# Patient Record
Sex: Male | Born: 1950 | Race: White | Hispanic: No | Marital: Married | Smoking: Never smoker
Health system: Southern US, Community
[De-identification: ages and names within clinical notes are randomized; demographics above are authoritative.]

---

## 2004-03-24 ENCOUNTER — Emergency Department (HOSPITAL_COMMUNITY): Admission: EM | Admit: 2004-03-24 | Discharge: 2004-03-24 | Payer: Self-pay | Admitting: *Deleted

## 2008-03-17 ENCOUNTER — Emergency Department (HOSPITAL_COMMUNITY): Admission: EM | Admit: 2008-03-17 | Discharge: 2008-03-17 | Payer: Self-pay | Admitting: Emergency Medicine

## 2010-10-02 ENCOUNTER — Encounter: Payer: Self-pay | Admitting: Family Medicine

## 2011-06-08 LAB — POCT I-STAT, CHEM 8
Calcium, Ion: 1.21
Creatinine, Ser: 1
HCT: 41
Hemoglobin: 13.9
Sodium: 141
TCO2: 24

## 2011-06-08 LAB — POCT CARDIAC MARKERS
Myoglobin, poc: 94.6
Operator id: 285491
Operator id: 285491

## 2014-09-18 ENCOUNTER — Encounter (HOSPITAL_COMMUNITY): Payer: Self-pay | Admitting: Emergency Medicine

## 2014-09-18 ENCOUNTER — Emergency Department (INDEPENDENT_AMBULATORY_CARE_PROVIDER_SITE_OTHER)
Admission: EM | Admit: 2014-09-18 | Discharge: 2014-09-18 | Disposition: A | Discharge status: Home / Self Care | Payer: BLUE CROSS/BLUE SHIELD | Source: Home / Self Care | Attending: Family Medicine | Admitting: Family Medicine

## 2014-09-18 DIAGNOSIS — N201 Calculus of ureter: Secondary | ICD-10-CM | POA: Diagnosis not present

## 2014-09-18 LAB — POCT URINALYSIS DIP (DEVICE)
Bilirubin Urine: NEGATIVE
Glucose, UA: NEGATIVE mg/dL
Ketones, ur: NEGATIVE mg/dL
Leukocytes, UA: NEGATIVE
Nitrite: NEGATIVE
PROTEIN: NEGATIVE mg/dL
UROBILINOGEN UA: 0.2 mg/dL (ref 0.0–1.0)
pH: 5 (ref 5.0–8.0)

## 2014-09-18 MED ORDER — KETOROLAC TROMETHAMINE 60 MG/2ML IM SOLN
INTRAMUSCULAR | Status: AC
  Filled 2014-09-18: qty 2

## 2014-09-18 MED ORDER — ONDANSETRON HCL 4 MG/2ML IJ SOLN
4.0000 mg | Freq: Once | INTRAMUSCULAR | Status: AC
  Administered 2014-09-18: 4 mg via INTRAMUSCULAR

## 2014-09-18 MED ORDER — ONDANSETRON HCL 4 MG/2ML IJ SOLN
INTRAMUSCULAR | Status: AC
  Filled 2014-09-18: qty 2

## 2014-09-18 MED ORDER — KETOROLAC TROMETHAMINE 60 MG/2ML IM SOLN
60.0000 mg | Freq: Once | INTRAMUSCULAR | Status: AC
  Administered 2014-09-18: 60 mg via INTRAMUSCULAR

## 2014-09-18 NOTE — Discharge Instructions (Signed)
Go directly to see dr t. For further eval.

## 2014-09-18 NOTE — ED Provider Notes (Signed)
CSN: [id number]     Arrival date & time 09/18/14  1448 History   First MD Initiated Contact with Patient 09/18/14 1457     Chief Complaint  Patient presents with  . Abdominal Pain   (Consider location/radiation/quality/duration/timing/severity/associated sxs/prior Treatment) Patient is a 64 y.o. male presenting with abdominal pain.  Abdominal Pain Pain location:  R flank (pt was fine prior to sudden onset in right flank.) Pain quality: cramping and pressure   Pain radiates to:  R flank and suprapubic region Pain severity:  Moderate Onset quality:  Sudden Duration:  2 hours Progression:  Unchanged Chronicity:  New Relieved by:  None tried Worsened by:  Nothing tried Ineffective treatments:  None tried Associated symptoms: nausea   Associated symptoms: no fever     History reviewed. No pertinent past medical history. History reviewed. No pertinent past surgical history. History reviewed. No pertinent family history. History  Substance Use Topics  . Smoking status: Never Smoker   . Smokeless tobacco: Not on file  . Alcohol Use: No    Review of Systems  Constitutional: Negative.  Negative for fever.  Gastrointestinal: Positive for nausea and abdominal pain.    Allergies  Review of patient's allergies indicates no known allergies.  Home Medications   Prior to Admission medications   Not on File   There were no vitals taken for this visit. Physical Exam  Constitutional: He is oriented to person, place, and time. He appears well-developed and well-nourished. He appears distressed.  Neck: Normal range of motion. Neck supple.  Cardiovascular: Normal heart sounds.   Pulmonary/Chest: Effort normal and breath sounds normal.  Abdominal: Soft. Bowel sounds are normal. He exhibits no distension and no mass. There is tenderness. There is no rebound and no guarding.  Neurological: He is alert and oriented to person, place, and time.  Skin: Skin is warm. He is diaphoretic.   Nursing note and vitals reviewed.   ED Course  Procedures (including critical care time) Labs Review Labs Reviewed  POCT URINALYSIS DIP (DEVICE) - Abnormal; Notable for the following:    Hgb urine dipstick TRACE (*)    All other components within normal limits    Imaging Review No results found.   MDM   1. Right ureteral stone        Billy Fischer, MD 09/18/14 1606

## 2014-09-18 NOTE — ED Notes (Signed)
Pt states that he has had abdominal pain since 1230 today. Pt states that it is very difficult to urinate

## 2017-06-28 DIAGNOSIS — Z23 Encounter for immunization: Secondary | ICD-10-CM | POA: Diagnosis not present

## 2017-07-25 DIAGNOSIS — N529 Male erectile dysfunction, unspecified: Secondary | ICD-10-CM | POA: Diagnosis not present

## 2017-07-25 DIAGNOSIS — J309 Allergic rhinitis, unspecified: Secondary | ICD-10-CM | POA: Diagnosis not present

## 2017-07-25 DIAGNOSIS — E782 Mixed hyperlipidemia: Secondary | ICD-10-CM | POA: Diagnosis not present

## 2017-07-25 DIAGNOSIS — M542 Cervicalgia: Secondary | ICD-10-CM | POA: Diagnosis not present

## 2017-07-25 DIAGNOSIS — Z23 Encounter for immunization: Secondary | ICD-10-CM | POA: Diagnosis not present

## 2017-07-25 DIAGNOSIS — Z0000 Encounter for general adult medical examination without abnormal findings: Secondary | ICD-10-CM | POA: Diagnosis not present

## 2017-07-25 DIAGNOSIS — Z125 Encounter for screening for malignant neoplasm of prostate: Secondary | ICD-10-CM | POA: Diagnosis not present

## 2017-07-25 DIAGNOSIS — G43909 Migraine, unspecified, not intractable, without status migrainosus: Secondary | ICD-10-CM | POA: Diagnosis not present

## 2017-07-25 DIAGNOSIS — E669 Obesity, unspecified: Secondary | ICD-10-CM | POA: Diagnosis not present

## 2017-09-26 DIAGNOSIS — D120 Benign neoplasm of cecum: Secondary | ICD-10-CM | POA: Diagnosis not present

## 2017-09-26 DIAGNOSIS — Z1211 Encounter for screening for malignant neoplasm of colon: Secondary | ICD-10-CM | POA: Diagnosis not present

## 2017-10-02 DIAGNOSIS — D120 Benign neoplasm of cecum: Secondary | ICD-10-CM | POA: Diagnosis not present

## 2018-02-01 DIAGNOSIS — T148XXA Other injury of unspecified body region, initial encounter: Secondary | ICD-10-CM | POA: Diagnosis not present

## 2018-02-01 DIAGNOSIS — Z23 Encounter for immunization: Secondary | ICD-10-CM | POA: Diagnosis not present

## 2018-08-02 DIAGNOSIS — Z0000 Encounter for general adult medical examination without abnormal findings: Secondary | ICD-10-CM | POA: Diagnosis not present

## 2018-08-02 DIAGNOSIS — G43909 Migraine, unspecified, not intractable, without status migrainosus: Secondary | ICD-10-CM | POA: Diagnosis not present

## 2018-08-02 DIAGNOSIS — Z125 Encounter for screening for malignant neoplasm of prostate: Secondary | ICD-10-CM | POA: Diagnosis not present

## 2018-08-02 DIAGNOSIS — M542 Cervicalgia: Secondary | ICD-10-CM | POA: Diagnosis not present

## 2018-08-02 DIAGNOSIS — Z6831 Body mass index (BMI) 31.0-31.9, adult: Secondary | ICD-10-CM | POA: Diagnosis not present

## 2018-08-02 DIAGNOSIS — E782 Mixed hyperlipidemia: Secondary | ICD-10-CM | POA: Diagnosis not present

## 2018-08-02 DIAGNOSIS — J309 Allergic rhinitis, unspecified: Secondary | ICD-10-CM | POA: Diagnosis not present

## 2018-08-02 DIAGNOSIS — Z23 Encounter for immunization: Secondary | ICD-10-CM | POA: Diagnosis not present

## 2018-08-02 DIAGNOSIS — N529 Male erectile dysfunction, unspecified: Secondary | ICD-10-CM | POA: Diagnosis not present

## 2018-08-02 DIAGNOSIS — E669 Obesity, unspecified: Secondary | ICD-10-CM | POA: Diagnosis not present

## 2019-05-21 ENCOUNTER — Encounter (HOSPITAL_COMMUNITY): Payer: Self-pay

## 2019-05-21 ENCOUNTER — Ambulatory Visit (INDEPENDENT_AMBULATORY_CARE_PROVIDER_SITE_OTHER): Payer: Medicare Other

## 2019-05-21 ENCOUNTER — Ambulatory Visit (HOSPITAL_COMMUNITY)
Admission: EM | Admit: 2019-05-21 | Discharge: 2019-05-21 | Disposition: A | Discharge status: Home / Self Care | Payer: Medicare Other | Attending: Urgent Care | Admitting: Urgent Care

## 2019-05-21 ENCOUNTER — Other Ambulatory Visit: Payer: Self-pay

## 2019-05-21 DIAGNOSIS — R109 Unspecified abdominal pain: Secondary | ICD-10-CM

## 2019-05-21 DIAGNOSIS — Z87442 Personal history of urinary calculi: Secondary | ICD-10-CM

## 2019-05-21 MED ORDER — TRAMADOL HCL 50 MG PO TABS: 50.0000 mg | ORAL_TABLET | Freq: Three times a day (TID) | ORAL | 0 refills | Status: AC | PRN

## 2019-05-21 MED ORDER — ONDANSETRON 4 MG PO TBDP
8.0000 mg | ORAL_TABLET | Freq: Once | ORAL | Status: AC
  Administered 2019-05-21: 8 mg via ORAL

## 2019-05-21 MED ORDER — ONDANSETRON 4 MG PO TBDP
ORAL_TABLET | ORAL | Status: AC
  Filled 2019-05-21: qty 2

## 2019-05-21 MED ORDER — HYDROCODONE-ACETAMINOPHEN 5-325 MG PO TABS
1.0000 | ORAL_TABLET | Freq: Once | ORAL | Status: AC
  Administered 2019-05-21: 1 via ORAL

## 2019-05-21 MED ORDER — TAMSULOSIN HCL 0.4 MG PO CAPS: 0.4000 mg | ORAL_CAPSULE | Freq: Every day | ORAL | 0 refills | Status: AC

## 2019-05-21 MED ORDER — HYDROCODONE-ACETAMINOPHEN 5-325 MG PO TABS
ORAL_TABLET | ORAL | Status: AC
  Filled 2019-05-21: qty 1

## 2019-05-21 MED ORDER — POLYETHYLENE GLYCOL 3350 17 G PO PACK: 17.0000 g | PACK | Freq: Every day | ORAL | 0 refills | Status: AC | PRN

## 2019-05-21 NOTE — ED Triage Notes (Signed)
Pt states he has a kidney stone. Pt states he felt bad this past Monday. Pt states he has had a kidney stone before and this pain feels the same.

## 2019-05-21 NOTE — ED Provider Notes (Signed)
MRN: [id number] DOB: 1951/02/01  Subjective:   Scott Kelly is a 68 y.o. male presenting for 2-day history of cute onset intermittent moderate to severe left flank pain that radiates anteriorly toward left lower abdomen and into groin.  Patient states he is also felt nauseated and feverish.  He has tried leftover hydrocodone from a previous episode of kidney stone on the right side.  He is also tried ibuprofen, Tylenol.  Last dose of ibuprofen was 600 mg at about 1 PM today.  He had some loose stools yesterday morning but more formed stool last night.  Denies blood in his stools.  Denies hematuria, chest pain, cough, throat pain.  He currently takes simvastatin for hyperlipidemia.  His PCP is Serita Grammes.  He is also been seen at [facility] but does not want to go back there.   Allergies  Allergen Reactions  . Sulfa Antibiotics     Past medical history of right ureteral stone, allergic rhinitis, migraine, renal stone, hyperlipidemia.   History reviewed. No pertinent surgical history.   Family History: Cataracts in his mother; Diabetes in his father; Thyroid disease in his mother.  Social History: Patient reports that he has never smoked. He has never used smokeless tobacco. He reports that he drinks about 5.0 ounces of alcohol per week.   ROS  Objective:   Vitals: BP (!) 153/93 (BP Location: Right Arm)   Pulse 95   Temp 98 F (36.7 C) (Oral)   Resp 20   Wt 212 lb (96.2 kg)   SpO2 97%   Physical Exam Constitutional:      General: He is not in acute distress.    Appearance: Normal appearance. He is well-developed. He is not ill-appearing, toxic-appearing or diaphoretic.  HENT:     Head: Normocephalic and atraumatic.     Right Ear: External ear normal.     Left Ear: External ear normal.     Nose: Nose normal.     Mouth/Throat:     Mouth: Mucous membranes are moist.     Pharynx: Oropharynx is clear.  Eyes:     General: No scleral icterus.    Extraocular  Movements: Extraocular movements intact.     Pupils: Pupils are equal, round, and reactive to light.  Neck:     Musculoskeletal: Normal range of motion and neck supple.  Cardiovascular:     Rate and Rhythm: Normal rate and regular rhythm.     Heart sounds: Normal heart sounds. No murmur. No friction rub. No gallop.   Pulmonary:     Effort: Pulmonary effort is normal. No respiratory distress.     Breath sounds: Normal breath sounds. No stridor. No wheezing, rhonchi or rales.  Abdominal:     General: Bowel sounds are normal. There is no distension.     Palpations: Abdomen is soft. There is no mass.     Tenderness: There is abdominal tenderness (mid lower abdomen-llq). There is left CVA tenderness (mild/lower). There is no right CVA tenderness, guarding or rebound.  Musculoskeletal: Normal range of motion.     Right lower leg: No edema.     Left lower leg: No edema.  Skin:    General: Skin is warm and dry.     Findings: No rash.  Neurological:     Mental Status: He is alert and oriented to person, place, and time.  Psychiatric:        Mood and Affect: Mood normal.        Behavior:  Behavior normal.        Thought Content: Thought content normal.        Judgment: Judgment normal.     Dg Abd 1 View  Result Date: 05/21/2019 CLINICAL DATA:  Left flank pain. EXAM: ABDOMEN - 1 VIEW COMPARISON:  CT urogram dated 09/18/2014 FINDINGS: No evidence of renal or ureteral calculi. Small calcifications in the pelvis are most likely phleboliths. However, I cannot exclude the possibility that 1 of these tiny calcifications could represent a distal ureteral stone. Bowel gas pattern is normal. No significant bone abnormality. IMPRESSION: Tiny calcifications in the pelvis are most likely phleboliths. However, the possibility of a distal ureteral stone should be considered. Electronically Signed   By: Lorriane Shire M.D.   On: 05/21/2019 14:56    Assessment and Plan :   1. Left flank pain   2. History of  renal stone   3. Abdominal pain, unspecified abdominal location     On review of radiographs, patient does not appear to have stool burden in ascending transverse and descending colon.  I agree that phleboliths may represent ureteral stones.  Patient's vitals are stable and pain is controlled with hydrocodone in clinic.  We will have patient use tramadol at home to avoid worsening stool burden.  Start Flomax, hydrate aggressively.  Use MiraLAX to help promote bowel movements. Counseled patient on potential for adverse effects with medications prescribed/recommended today, ER and return-to-clinic precautions discussed, patient verbalized understanding.    Jaynee Eagles, PA-C 05/21/19 1527

## 2019-07-19 DIAGNOSIS — Z23 Encounter for immunization: Secondary | ICD-10-CM | POA: Diagnosis not present

## 2019-08-15 DIAGNOSIS — E782 Mixed hyperlipidemia: Secondary | ICD-10-CM | POA: Diagnosis not present

## 2019-08-15 DIAGNOSIS — Z125 Encounter for screening for malignant neoplasm of prostate: Secondary | ICD-10-CM | POA: Diagnosis not present

## 2019-08-20 DIAGNOSIS — Z87442 Personal history of urinary calculi: Secondary | ICD-10-CM | POA: Diagnosis not present

## 2019-08-20 DIAGNOSIS — Z125 Encounter for screening for malignant neoplasm of prostate: Secondary | ICD-10-CM | POA: Diagnosis not present

## 2019-08-20 DIAGNOSIS — E669 Obesity, unspecified: Secondary | ICD-10-CM | POA: Diagnosis not present

## 2019-08-20 DIAGNOSIS — N400 Enlarged prostate without lower urinary tract symptoms: Secondary | ICD-10-CM | POA: Diagnosis not present

## 2019-08-20 DIAGNOSIS — J309 Allergic rhinitis, unspecified: Secondary | ICD-10-CM | POA: Diagnosis not present

## 2019-08-20 DIAGNOSIS — Z0000 Encounter for general adult medical examination without abnormal findings: Secondary | ICD-10-CM | POA: Diagnosis not present

## 2019-08-20 DIAGNOSIS — M545 Low back pain: Secondary | ICD-10-CM | POA: Diagnosis not present

## 2019-08-20 DIAGNOSIS — N529 Male erectile dysfunction, unspecified: Secondary | ICD-10-CM | POA: Diagnosis not present

## 2019-08-20 DIAGNOSIS — E782 Mixed hyperlipidemia: Secondary | ICD-10-CM | POA: Diagnosis not present

## 2019-08-20 DIAGNOSIS — G43909 Migraine, unspecified, not intractable, without status migrainosus: Secondary | ICD-10-CM | POA: Diagnosis not present

## 2019-08-20 DIAGNOSIS — R5383 Other fatigue: Secondary | ICD-10-CM | POA: Diagnosis not present

## 2019-08-20 DIAGNOSIS — M542 Cervicalgia: Secondary | ICD-10-CM | POA: Diagnosis not present

## 2019-09-23 DIAGNOSIS — G4700 Insomnia, unspecified: Secondary | ICD-10-CM | POA: Diagnosis not present

## 2019-09-23 DIAGNOSIS — Z7189 Other specified counseling: Secondary | ICD-10-CM | POA: Diagnosis not present

## 2019-09-23 DIAGNOSIS — K219 Gastro-esophageal reflux disease without esophagitis: Secondary | ICD-10-CM | POA: Diagnosis not present

## 2019-09-23 DIAGNOSIS — E291 Testicular hypofunction: Secondary | ICD-10-CM | POA: Diagnosis not present

## 2019-09-23 DIAGNOSIS — E611 Iron deficiency: Secondary | ICD-10-CM | POA: Diagnosis not present

## 2019-10-02 ENCOUNTER — Ambulatory Visit: Payer: Medicare Other | Attending: Internal Medicine

## 2019-10-02 DIAGNOSIS — Z23 Encounter for immunization: Secondary | ICD-10-CM | POA: Insufficient documentation

## 2019-10-02 NOTE — Progress Notes (Signed)
   Covid-19 Vaccination Clinic  Name:  Scott Kelly    MRN: [id number] DOB: [birth year]  10/02/2019  [name] was observed post Covid-19 immunization for 15 minutes without incidence. He was provided with Vaccine Information Sheet and instruction to access the V-Safe system.   [name] was instructed to call 911 with any severe reactions post vaccine: Marland Kitchen Difficulty breathing  . Swelling of your face and throat  . A fast heartbeat  . A bad rash all over your body  . Dizziness and weakness    Immunizations Administered    Name Date Dose VIS Date Route   Pfizer COVID-19 Vaccine 10/02/2019 10:24 AM 0.3 mL 08/22/2019 Intramuscular   Manufacturer: [place]   Lot: [id number]   [place]: [id number]

## 2019-10-23 ENCOUNTER — Ambulatory Visit: Payer: Medicare Other | Attending: Internal Medicine

## 2019-10-23 DIAGNOSIS — Z23 Encounter for immunization: Secondary | ICD-10-CM | POA: Insufficient documentation

## 2019-10-23 NOTE — Progress Notes (Signed)
   Covid-19 Vaccination Clinic  Name:  Scott Kelly    MRN: [id number] DOB: Jun 02, 1951  10/23/2019  [name] was observed post Covid-19 immunization for 15 minutes without incidence. He was provided with Vaccine Information Sheet and instruction to access the V-Safe system.   [name] was instructed to call 911 with any severe reactions post vaccine: Marland Kitchen Difficulty breathing  . Swelling of your face and throat  . A fast heartbeat  . A bad rash all over your body  . Dizziness and weakness

## 2019-12-11 DIAGNOSIS — R030 Elevated blood-pressure reading, without diagnosis of hypertension: Secondary | ICD-10-CM | POA: Diagnosis not present

## 2019-12-11 DIAGNOSIS — J309 Allergic rhinitis, unspecified: Secondary | ICD-10-CM | POA: Diagnosis not present

## 2020-03-22 ENCOUNTER — Ambulatory Visit
Admission: RE | Admit: 2020-03-22 | Discharge: 2020-03-22 | Disposition: A | Discharge status: Home / Self Care | Payer: Medicare Other | Source: Ambulatory Visit | Attending: Internal Medicine | Admitting: Internal Medicine

## 2020-03-22 ENCOUNTER — Other Ambulatory Visit: Payer: Self-pay | Admitting: Internal Medicine

## 2020-03-22 DIAGNOSIS — M4727 Other spondylosis with radiculopathy, lumbosacral region: Secondary | ICD-10-CM | POA: Diagnosis not present

## 2020-03-22 DIAGNOSIS — M542 Cervicalgia: Secondary | ICD-10-CM | POA: Diagnosis not present

## 2020-03-22 DIAGNOSIS — M4726 Other spondylosis with radiculopathy, lumbar region: Secondary | ICD-10-CM | POA: Diagnosis not present

## 2020-03-22 DIAGNOSIS — M5117 Intervertebral disc disorders with radiculopathy, lumbosacral region: Secondary | ICD-10-CM | POA: Diagnosis not present

## 2020-03-22 DIAGNOSIS — M5432 Sciatica, left side: Secondary | ICD-10-CM

## 2020-03-22 DIAGNOSIS — M5116 Intervertebral disc disorders with radiculopathy, lumbar region: Secondary | ICD-10-CM | POA: Diagnosis not present

## 2020-03-22 DIAGNOSIS — M5090 Cervical disc disorder, unspecified, unspecified cervical region: Secondary | ICD-10-CM

## 2020-04-02 DIAGNOSIS — M545 Low back pain: Secondary | ICD-10-CM | POA: Diagnosis not present

## 2020-04-08 DIAGNOSIS — M545 Low back pain: Secondary | ICD-10-CM | POA: Diagnosis not present

## 2020-04-12 DIAGNOSIS — H5211 Myopia, right eye: Secondary | ICD-10-CM | POA: Diagnosis not present

## 2020-04-12 DIAGNOSIS — H33001 Unspecified retinal detachment with retinal break, right eye: Secondary | ICD-10-CM | POA: Diagnosis not present

## 2020-04-12 DIAGNOSIS — H3552 Pigmentary retinal dystrophy: Secondary | ICD-10-CM | POA: Diagnosis not present

## 2020-04-12 DIAGNOSIS — H2513 Age-related nuclear cataract, bilateral: Secondary | ICD-10-CM | POA: Diagnosis not present

## 2020-04-12 DIAGNOSIS — H52201 Unspecified astigmatism, right eye: Secondary | ICD-10-CM | POA: Diagnosis not present

## 2020-04-12 DIAGNOSIS — H524 Presbyopia: Secondary | ICD-10-CM | POA: Diagnosis not present

## 2020-04-13 DIAGNOSIS — D2272 Melanocytic nevi of left lower limb, including hip: Secondary | ICD-10-CM | POA: Diagnosis not present

## 2020-04-13 DIAGNOSIS — Z1283 Encounter for screening for malignant neoplasm of skin: Secondary | ICD-10-CM | POA: Diagnosis not present

## 2020-04-13 DIAGNOSIS — D225 Melanocytic nevi of trunk: Secondary | ICD-10-CM | POA: Diagnosis not present

## 2020-04-13 DIAGNOSIS — L218 Other seborrheic dermatitis: Secondary | ICD-10-CM | POA: Diagnosis not present

## 2020-04-13 DIAGNOSIS — D485 Neoplasm of uncertain behavior of skin: Secondary | ICD-10-CM | POA: Diagnosis not present

## 2020-05-11 DIAGNOSIS — M5416 Radiculopathy, lumbar region: Secondary | ICD-10-CM | POA: Diagnosis not present

## 2020-05-19 DIAGNOSIS — Z135 Encounter for screening for eye and ear disorders: Secondary | ICD-10-CM | POA: Diagnosis not present

## 2020-05-19 DIAGNOSIS — M545 Low back pain: Secondary | ICD-10-CM | POA: Diagnosis not present

## 2020-05-19 DIAGNOSIS — M5416 Radiculopathy, lumbar region: Secondary | ICD-10-CM | POA: Diagnosis not present

## 2020-05-26 DIAGNOSIS — M5137 Other intervertebral disc degeneration, lumbosacral region: Secondary | ICD-10-CM | POA: Diagnosis not present

## 2020-05-26 DIAGNOSIS — M48061 Spinal stenosis, lumbar region without neurogenic claudication: Secondary | ICD-10-CM | POA: Diagnosis not present

## 2020-06-03 DIAGNOSIS — Z23 Encounter for immunization: Secondary | ICD-10-CM | POA: Diagnosis not present

## 2020-06-14 DIAGNOSIS — M5416 Radiculopathy, lumbar region: Secondary | ICD-10-CM | POA: Diagnosis not present

## 2020-06-14 DIAGNOSIS — M48061 Spinal stenosis, lumbar region without neurogenic claudication: Secondary | ICD-10-CM | POA: Diagnosis not present

## 2020-06-16 DIAGNOSIS — M5416 Radiculopathy, lumbar region: Secondary | ICD-10-CM | POA: Diagnosis not present

## 2020-06-16 DIAGNOSIS — M48061 Spinal stenosis, lumbar region without neurogenic claudication: Secondary | ICD-10-CM | POA: Diagnosis not present

## 2020-06-16 DIAGNOSIS — Z6828 Body mass index (BMI) 28.0-28.9, adult: Secondary | ICD-10-CM | POA: Diagnosis not present

## 2020-07-30 DIAGNOSIS — M5416 Radiculopathy, lumbar region: Secondary | ICD-10-CM | POA: Diagnosis not present

## 2020-07-30 DIAGNOSIS — M48061 Spinal stenosis, lumbar region without neurogenic claudication: Secondary | ICD-10-CM | POA: Diagnosis not present

## 2020-07-30 DIAGNOSIS — R030 Elevated blood-pressure reading, without diagnosis of hypertension: Secondary | ICD-10-CM | POA: Diagnosis not present

## 2020-07-30 DIAGNOSIS — Z6828 Body mass index (BMI) 28.0-28.9, adult: Secondary | ICD-10-CM | POA: Diagnosis not present

## 2020-08-27 DIAGNOSIS — R030 Elevated blood-pressure reading, without diagnosis of hypertension: Secondary | ICD-10-CM | POA: Diagnosis not present

## 2020-08-27 DIAGNOSIS — Z6827 Body mass index (BMI) 27.0-27.9, adult: Secondary | ICD-10-CM | POA: Diagnosis not present

## 2020-08-27 DIAGNOSIS — M5416 Radiculopathy, lumbar region: Secondary | ICD-10-CM | POA: Diagnosis not present

## 2020-09-06 DIAGNOSIS — E291 Testicular hypofunction: Secondary | ICD-10-CM | POA: Diagnosis not present

## 2020-09-06 DIAGNOSIS — R634 Abnormal weight loss: Secondary | ICD-10-CM | POA: Diagnosis not present

## 2020-09-06 DIAGNOSIS — J309 Allergic rhinitis, unspecified: Secondary | ICD-10-CM | POA: Diagnosis not present

## 2020-09-06 DIAGNOSIS — Z1389 Encounter for screening for other disorder: Secondary | ICD-10-CM | POA: Diagnosis not present

## 2020-09-06 DIAGNOSIS — Z1159 Encounter for screening for other viral diseases: Secondary | ICD-10-CM | POA: Diagnosis not present

## 2020-09-06 DIAGNOSIS — Z23 Encounter for immunization: Secondary | ICD-10-CM | POA: Diagnosis not present

## 2020-09-06 DIAGNOSIS — I10 Essential (primary) hypertension: Secondary | ICD-10-CM | POA: Diagnosis not present

## 2020-09-06 DIAGNOSIS — Z0000 Encounter for general adult medical examination without abnormal findings: Secondary | ICD-10-CM | POA: Diagnosis not present

## 2020-09-06 DIAGNOSIS — G43909 Migraine, unspecified, not intractable, without status migrainosus: Secondary | ICD-10-CM | POA: Diagnosis not present

## 2020-09-06 DIAGNOSIS — E782 Mixed hyperlipidemia: Secondary | ICD-10-CM | POA: Diagnosis not present

## 2020-09-06 DIAGNOSIS — N400 Enlarged prostate without lower urinary tract symptoms: Secondary | ICD-10-CM | POA: Diagnosis not present

## 2020-09-06 DIAGNOSIS — E611 Iron deficiency: Secondary | ICD-10-CM | POA: Diagnosis not present

## 2020-09-23 DIAGNOSIS — M48061 Spinal stenosis, lumbar region without neurogenic claudication: Secondary | ICD-10-CM | POA: Diagnosis not present

## 2020-11-04 DIAGNOSIS — M48061 Spinal stenosis, lumbar region without neurogenic claudication: Secondary | ICD-10-CM | POA: Diagnosis not present

## 2020-12-29 DIAGNOSIS — I10 Essential (primary) hypertension: Secondary | ICD-10-CM | POA: Diagnosis not present

## 2020-12-29 DIAGNOSIS — Z6830 Body mass index (BMI) 30.0-30.9, adult: Secondary | ICD-10-CM | POA: Diagnosis not present

## 2020-12-29 DIAGNOSIS — M48061 Spinal stenosis, lumbar region without neurogenic claudication: Secondary | ICD-10-CM | POA: Diagnosis not present

## 2021-01-06 DIAGNOSIS — E782 Mixed hyperlipidemia: Secondary | ICD-10-CM | POA: Diagnosis not present

## 2021-01-06 DIAGNOSIS — M5116 Intervertebral disc disorders with radiculopathy, lumbar region: Secondary | ICD-10-CM | POA: Diagnosis not present

## 2021-01-06 DIAGNOSIS — J309 Allergic rhinitis, unspecified: Secondary | ICD-10-CM | POA: Diagnosis not present

## 2021-01-06 DIAGNOSIS — I10 Essential (primary) hypertension: Secondary | ICD-10-CM | POA: Diagnosis not present

## 2021-01-06 DIAGNOSIS — R0981 Nasal congestion: Secondary | ICD-10-CM | POA: Diagnosis not present

## 2021-04-20 DIAGNOSIS — D225 Melanocytic nevi of trunk: Secondary | ICD-10-CM | POA: Diagnosis not present

## 2021-04-20 DIAGNOSIS — Z1283 Encounter for screening for malignant neoplasm of skin: Secondary | ICD-10-CM | POA: Diagnosis not present

## 2021-04-20 DIAGNOSIS — D485 Neoplasm of uncertain behavior of skin: Secondary | ICD-10-CM | POA: Diagnosis not present

## 2021-04-20 DIAGNOSIS — L905 Scar conditions and fibrosis of skin: Secondary | ICD-10-CM | POA: Diagnosis not present

## 2021-06-29 DIAGNOSIS — M48061 Spinal stenosis, lumbar region without neurogenic claudication: Secondary | ICD-10-CM | POA: Diagnosis not present

## 2021-06-30 DIAGNOSIS — U071 COVID-19: Secondary | ICD-10-CM | POA: Diagnosis not present

## 2021-08-19 DIAGNOSIS — Z1283 Encounter for screening for malignant neoplasm of skin: Secondary | ICD-10-CM | POA: Diagnosis not present

## 2021-08-19 DIAGNOSIS — D225 Melanocytic nevi of trunk: Secondary | ICD-10-CM | POA: Diagnosis not present

## 2021-08-19 DIAGNOSIS — D485 Neoplasm of uncertain behavior of skin: Secondary | ICD-10-CM | POA: Diagnosis not present

## 2021-09-14 ENCOUNTER — Other Ambulatory Visit: Payer: Self-pay | Admitting: Internal Medicine

## 2021-09-14 DIAGNOSIS — R0683 Snoring: Secondary | ICD-10-CM | POA: Diagnosis not present

## 2021-09-14 DIAGNOSIS — N529 Male erectile dysfunction, unspecified: Secondary | ICD-10-CM | POA: Diagnosis not present

## 2021-09-14 DIAGNOSIS — Z1389 Encounter for screening for other disorder: Secondary | ICD-10-CM | POA: Diagnosis not present

## 2021-09-14 DIAGNOSIS — R5383 Other fatigue: Secondary | ICD-10-CM

## 2021-09-14 DIAGNOSIS — Z0000 Encounter for general adult medical examination without abnormal findings: Secondary | ICD-10-CM | POA: Diagnosis not present

## 2021-09-14 DIAGNOSIS — E782 Mixed hyperlipidemia: Secondary | ICD-10-CM | POA: Diagnosis not present

## 2021-09-14 DIAGNOSIS — N400 Enlarged prostate without lower urinary tract symptoms: Secondary | ICD-10-CM | POA: Diagnosis not present

## 2021-09-14 DIAGNOSIS — I10 Essential (primary) hypertension: Secondary | ICD-10-CM | POA: Diagnosis not present

## 2021-09-14 DIAGNOSIS — J309 Allergic rhinitis, unspecified: Secondary | ICD-10-CM | POA: Diagnosis not present

## 2021-09-14 DIAGNOSIS — Z125 Encounter for screening for malignant neoplasm of prostate: Secondary | ICD-10-CM | POA: Diagnosis not present

## 2021-09-14 DIAGNOSIS — Z87442 Personal history of urinary calculi: Secondary | ICD-10-CM | POA: Diagnosis not present

## 2021-09-14 DIAGNOSIS — Z23 Encounter for immunization: Secondary | ICD-10-CM | POA: Diagnosis not present

## 2021-10-10 ENCOUNTER — Ambulatory Visit
Admission: RE | Admit: 2021-10-10 | Discharge: 2021-10-10 | Disposition: A | Discharge status: Home / Self Care | Payer: No Typology Code available for payment source | Source: Ambulatory Visit | Attending: Internal Medicine | Admitting: Internal Medicine

## 2021-10-10 DIAGNOSIS — R5383 Other fatigue: Secondary | ICD-10-CM

## 2021-10-10 DIAGNOSIS — E782 Mixed hyperlipidemia: Secondary | ICD-10-CM

## 2021-11-02 DIAGNOSIS — H33001 Unspecified retinal detachment with retinal break, right eye: Secondary | ICD-10-CM | POA: Diagnosis not present

## 2021-11-02 DIAGNOSIS — H2513 Age-related nuclear cataract, bilateral: Secondary | ICD-10-CM | POA: Diagnosis not present

## 2021-11-02 DIAGNOSIS — H3552 Pigmentary retinal dystrophy: Secondary | ICD-10-CM | POA: Diagnosis not present

## 2021-11-21 DIAGNOSIS — R5383 Other fatigue: Secondary | ICD-10-CM | POA: Diagnosis not present

## 2022-03-22 DIAGNOSIS — N400 Enlarged prostate without lower urinary tract symptoms: Secondary | ICD-10-CM | POA: Diagnosis not present

## 2022-03-22 DIAGNOSIS — E782 Mixed hyperlipidemia: Secondary | ICD-10-CM | POA: Diagnosis not present

## 2022-03-22 DIAGNOSIS — R5383 Other fatigue: Secondary | ICD-10-CM | POA: Diagnosis not present

## 2022-03-22 DIAGNOSIS — G4700 Insomnia, unspecified: Secondary | ICD-10-CM | POA: Diagnosis not present

## 2022-03-22 DIAGNOSIS — I10 Essential (primary) hypertension: Secondary | ICD-10-CM | POA: Diagnosis not present

## 2022-03-22 DIAGNOSIS — N529 Male erectile dysfunction, unspecified: Secondary | ICD-10-CM | POA: Diagnosis not present

## 2022-04-09 IMAGING — CT CT CARDIAC CORONARY ARTERY CALCIUM SCORE
3 series · 14 of 20 positions shown, 16 images · non-contrast
Comparison: None.

CLINICAL DATA: 70-year-old white male with mixed hyperlipidemia.

EXAM:
CT CARDIAC CORONARY ARTERY CALCIUM SCORE
TECHNIQUE: Non-contrast imaging through the heart was performed using
prospective ECG gating. Image post processing was performed on an
independent workstation, allowing for quantitative analysis of the
heart and coronary arteries. Note that this exam targets the heart
and the chest was not imaged in its entirety.

[Series 2: calcium scoring 2.00 qr36 bestdiast 71% hrt calciu · axial · 0.37mm/px · z∈[+1584,+1662]mm · 4 of 67 slices shown]
[im 14/67  vessel]
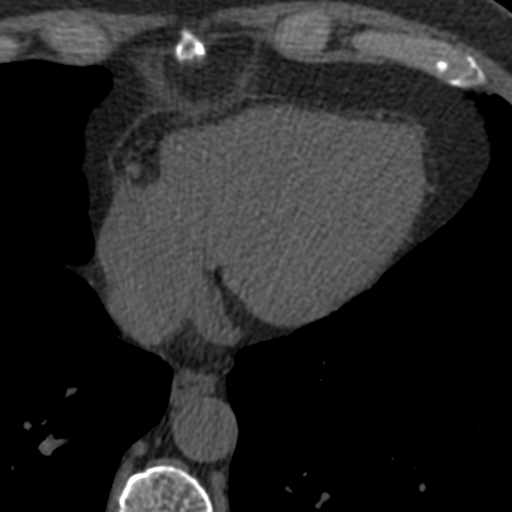
[im 27/67  vessel]
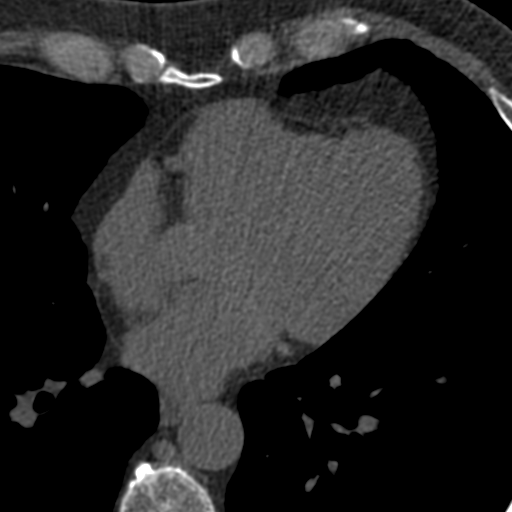
[im 40/67  vessel]
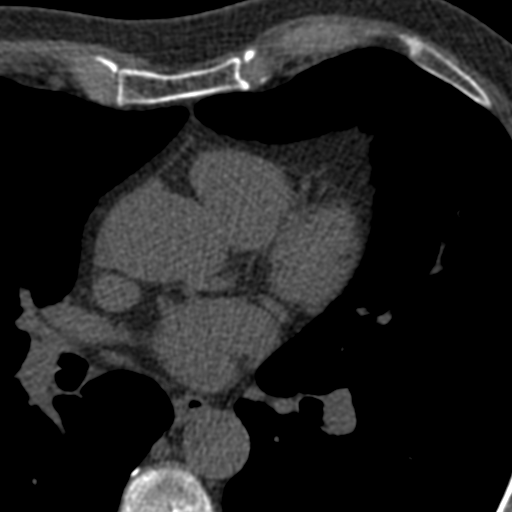
[im 53/67  vessel]
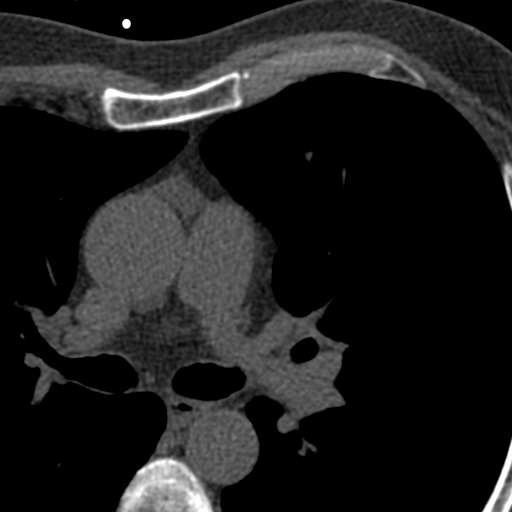

[Series 3: calcium scoring 2.00 br40 bestdiast 71% axial · axial · 0.64mm/px · z∈[+1561,+1663]mm · 5 of 77 slices shown, 7 images]
[im 13/77  vessel]
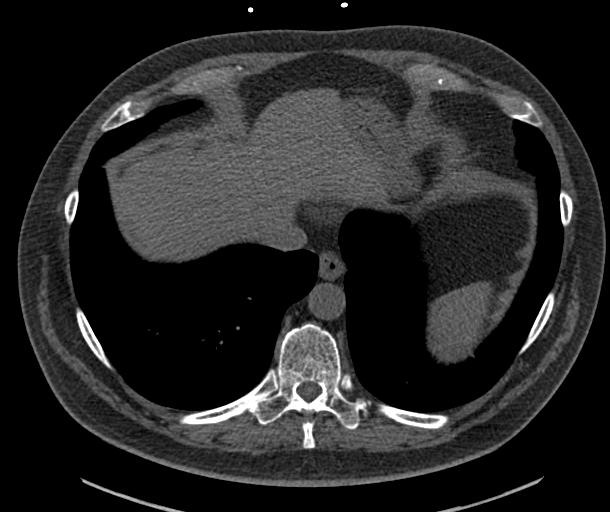
[im 13/77  lung]
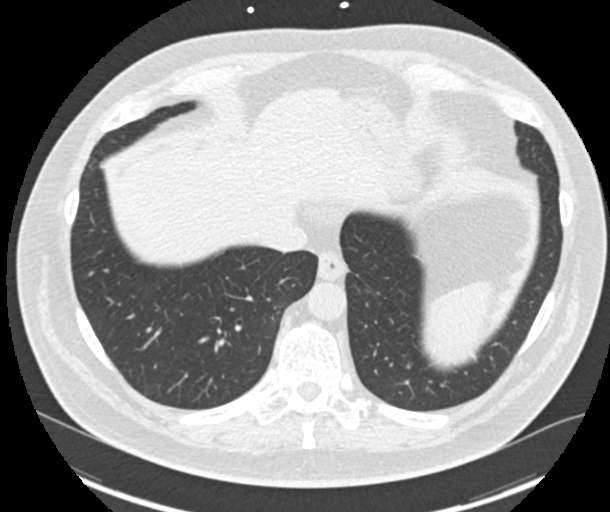
[im 26/77  vessel]
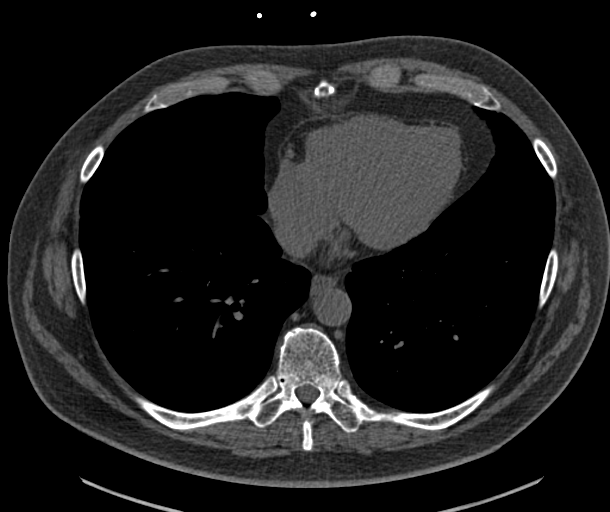
[im 39/77  vessel]
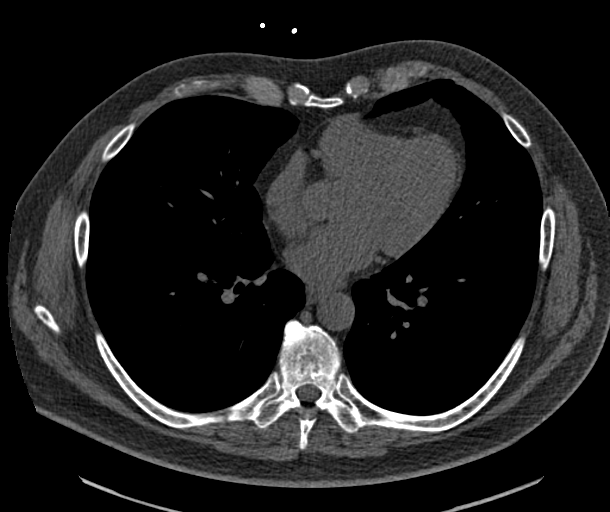
[im 51/77  vessel]
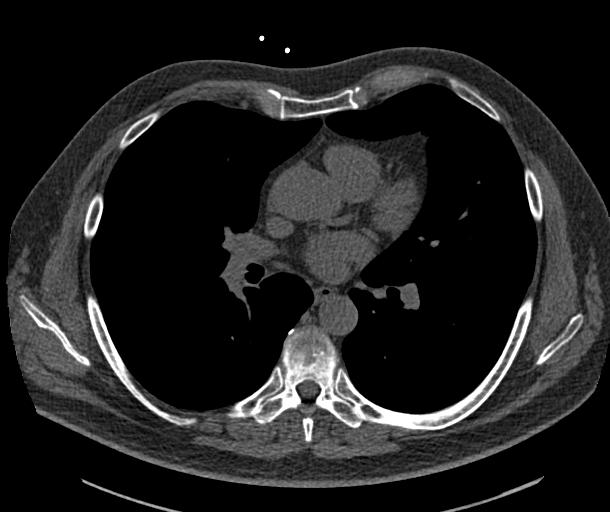
[im 64/77  vessel]
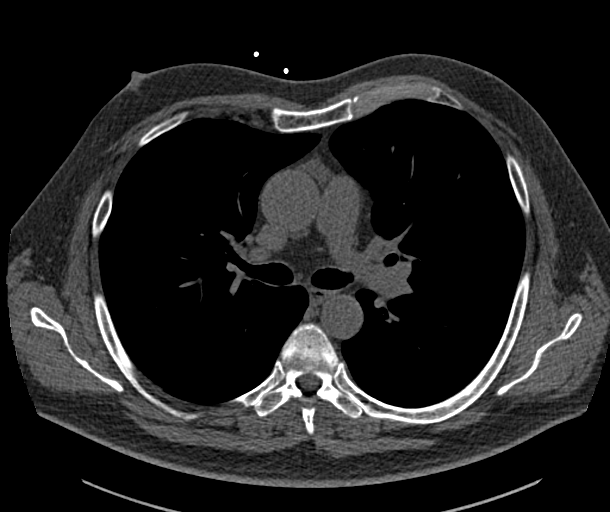
[im 64/77  lung]
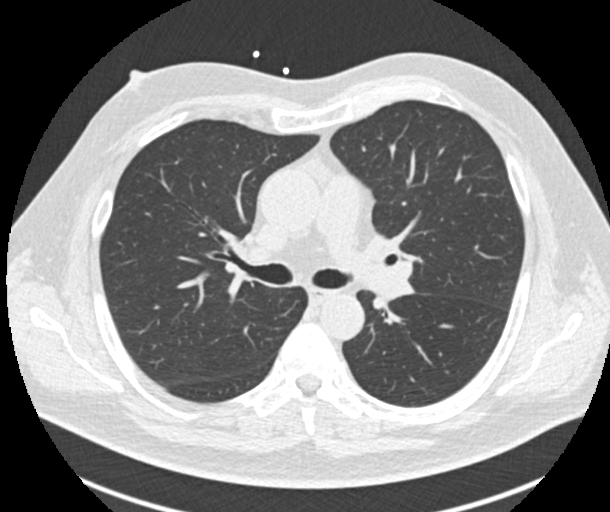

[Series 9: calcium scoring 2.00 br60 bestdiast 71% lungs · axial · 0.64mm/px · z∈[+1561,+1663]mm · 5 of 77 slices shown]
[im 13/77  vessel]
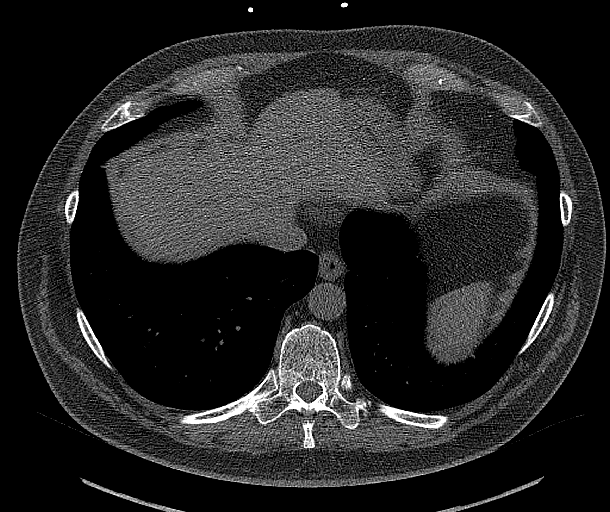
[im 26/77  vessel]
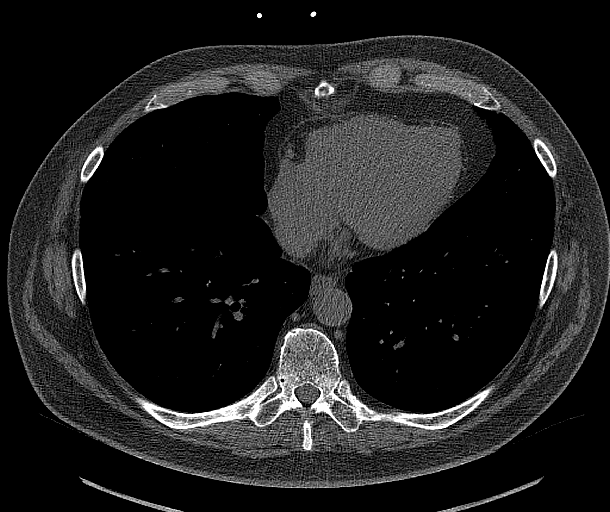
[im 39/77  vessel]
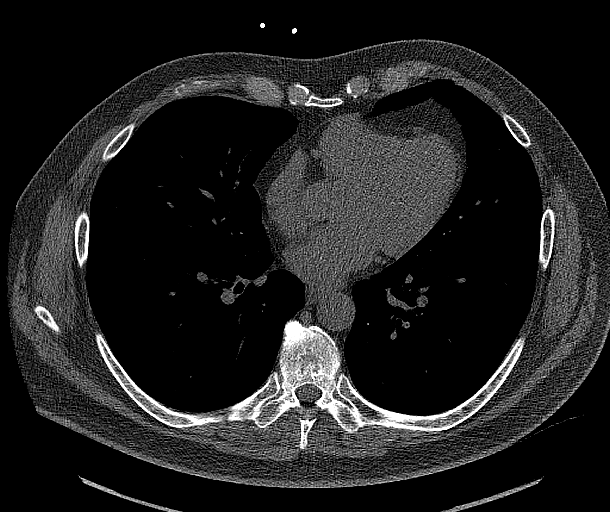
[im 51/77  vessel]
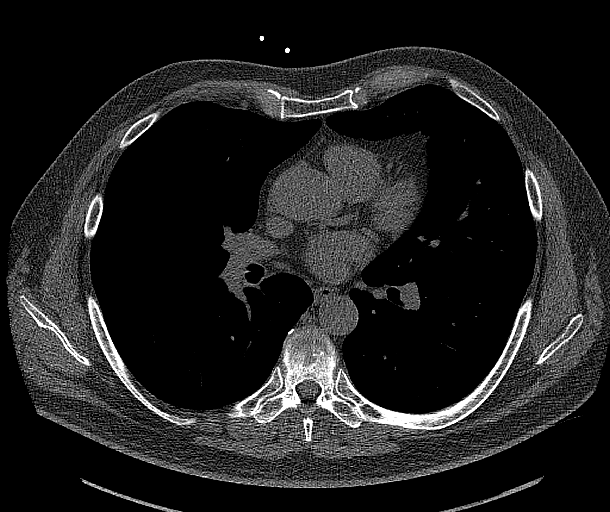
[im 64/77  vessel]
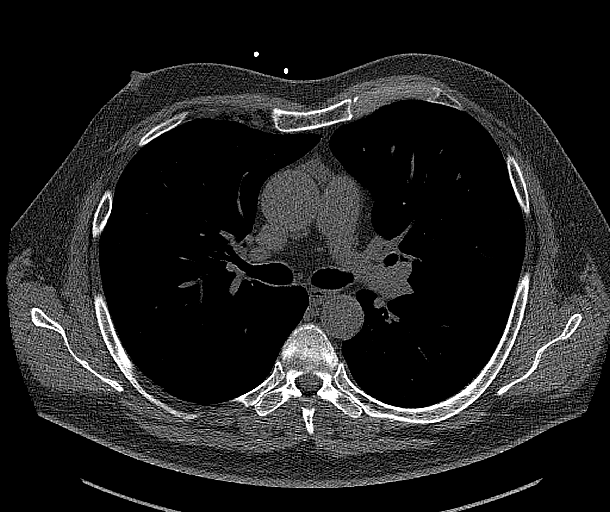

[14 of 20 positions shown; findings below may reference images not displayed]

FINDINGS: CORONARY CALCIUM SCORES:

Left Main: 0

LAD: 0

LCx: 0

RCA: 0

Total Agatston Score: 0

[HOSPITAL] percentile: 0

AORTA MEASUREMENTS:

Ascending Aorta: 35 mm

Descending Aorta: 28 mm

OTHER FINDINGS:

Heart size normal. No significant pericardial fluid. Visualized
mediastinal structures are normal. Images of the upper abdomen are
unremarkable. 3 mm nodule in left lower lobe on sequence 9 image 47.
Elongated nodular density in the left lower lobe measuring 4 mm on
sequence 9 image 43. No large areas of airspace disease or
consolidation in the visualized lungs. No large pleural effusions.
No acute bone abnormality.
IMPRESSION: 1. Coronary calcium score is 0.
2. Small indeterminate pulmonary nodules, largest measures 4 mm. No
follow-up needed if patient is low-risk (and has no known or
suspected primary neoplasm). Non-contrast chest CT can be considered
in 12 months if patient is high-risk. This recommendation follows
the consensus statement: Guidelines for Management of Incidental
Pulmonary Nodules Detected on CT Images: From the [HOSPITAL]

## 2022-05-26 DIAGNOSIS — R5383 Other fatigue: Secondary | ICD-10-CM | POA: Diagnosis not present

## 2022-05-26 DIAGNOSIS — E782 Mixed hyperlipidemia: Secondary | ICD-10-CM | POA: Diagnosis not present

## 2022-09-20 DIAGNOSIS — Z23 Encounter for immunization: Secondary | ICD-10-CM | POA: Diagnosis not present

## 2022-09-20 DIAGNOSIS — M5090 Cervical disc disorder, unspecified, unspecified cervical region: Secondary | ICD-10-CM | POA: Diagnosis not present

## 2022-09-20 DIAGNOSIS — I10 Essential (primary) hypertension: Secondary | ICD-10-CM | POA: Diagnosis not present

## 2022-09-20 DIAGNOSIS — G4700 Insomnia, unspecified: Secondary | ICD-10-CM | POA: Diagnosis not present

## 2022-09-20 DIAGNOSIS — Z1331 Encounter for screening for depression: Secondary | ICD-10-CM | POA: Diagnosis not present

## 2022-09-20 DIAGNOSIS — N400 Enlarged prostate without lower urinary tract symptoms: Secondary | ICD-10-CM | POA: Diagnosis not present

## 2022-09-20 DIAGNOSIS — E782 Mixed hyperlipidemia: Secondary | ICD-10-CM | POA: Diagnosis not present

## 2022-09-20 DIAGNOSIS — E611 Iron deficiency: Secondary | ICD-10-CM | POA: Diagnosis not present

## 2022-09-20 DIAGNOSIS — J309 Allergic rhinitis, unspecified: Secondary | ICD-10-CM | POA: Diagnosis not present

## 2022-09-20 DIAGNOSIS — Z0000 Encounter for general adult medical examination without abnormal findings: Secondary | ICD-10-CM | POA: Diagnosis not present

## 2022-10-01 DIAGNOSIS — J0140 Acute pansinusitis, unspecified: Secondary | ICD-10-CM | POA: Diagnosis not present

## 2022-10-01 DIAGNOSIS — R519 Headache, unspecified: Secondary | ICD-10-CM | POA: Diagnosis not present

## 2022-10-01 DIAGNOSIS — Z03818 Encounter for observation for suspected exposure to other biological agents ruled out: Secondary | ICD-10-CM | POA: Diagnosis not present

## 2022-10-01 DIAGNOSIS — R0981 Nasal congestion: Secondary | ICD-10-CM | POA: Diagnosis not present

## 2022-10-01 DIAGNOSIS — J029 Acute pharyngitis, unspecified: Secondary | ICD-10-CM | POA: Diagnosis not present

## 2022-10-18 DIAGNOSIS — D72819 Decreased white blood cell count, unspecified: Secondary | ICD-10-CM | POA: Diagnosis not present

## 2022-11-02 DIAGNOSIS — H3552 Pigmentary retinal dystrophy: Secondary | ICD-10-CM | POA: Diagnosis not present

## 2022-11-02 DIAGNOSIS — H5213 Myopia, bilateral: Secondary | ICD-10-CM | POA: Diagnosis not present

## 2022-11-02 DIAGNOSIS — H2513 Age-related nuclear cataract, bilateral: Secondary | ICD-10-CM | POA: Diagnosis not present

## 2022-11-02 DIAGNOSIS — H524 Presbyopia: Secondary | ICD-10-CM | POA: Diagnosis not present

## 2022-11-02 DIAGNOSIS — H33001 Unspecified retinal detachment with retinal break, right eye: Secondary | ICD-10-CM | POA: Diagnosis not present

## 2022-11-02 DIAGNOSIS — H52203 Unspecified astigmatism, bilateral: Secondary | ICD-10-CM | POA: Diagnosis not present

## 2023-09-18 DIAGNOSIS — Z23 Encounter for immunization: Secondary | ICD-10-CM | POA: Diagnosis not present

## 2023-09-25 DIAGNOSIS — N400 Enlarged prostate without lower urinary tract symptoms: Secondary | ICD-10-CM | POA: Diagnosis not present

## 2023-09-25 DIAGNOSIS — E782 Mixed hyperlipidemia: Secondary | ICD-10-CM | POA: Diagnosis not present

## 2023-09-25 DIAGNOSIS — Z0000 Encounter for general adult medical examination without abnormal findings: Secondary | ICD-10-CM | POA: Diagnosis not present

## 2023-09-25 DIAGNOSIS — M779 Enthesopathy, unspecified: Secondary | ICD-10-CM | POA: Diagnosis not present

## 2023-09-25 DIAGNOSIS — Z1389 Encounter for screening for other disorder: Secondary | ICD-10-CM | POA: Diagnosis not present

## 2023-09-25 DIAGNOSIS — M519 Unspecified thoracic, thoracolumbar and lumbosacral intervertebral disc disorder: Secondary | ICD-10-CM | POA: Diagnosis not present

## 2023-09-25 DIAGNOSIS — N529 Male erectile dysfunction, unspecified: Secondary | ICD-10-CM | POA: Diagnosis not present

## 2023-09-25 DIAGNOSIS — G43909 Migraine, unspecified, not intractable, without status migrainosus: Secondary | ICD-10-CM | POA: Diagnosis not present

## 2023-09-25 DIAGNOSIS — I10 Essential (primary) hypertension: Secondary | ICD-10-CM | POA: Diagnosis not present
